# Patient Record
Sex: Male | Born: 1940
Health system: Southern US, Community
[De-identification: ages and names within clinical notes are randomized; demographics above are authoritative.]

## PROBLEM LIST (undated history)

## (undated) DIAGNOSIS — Z9289 Personal history of other medical treatment: Secondary | ICD-10-CM

## (undated) DIAGNOSIS — Q999 Chromosomal abnormality, unspecified: Secondary | ICD-10-CM

## (undated) DIAGNOSIS — R0789 Other chest pain: Secondary | ICD-10-CM

## (undated) DIAGNOSIS — I2510 Atherosclerotic heart disease of native coronary artery without angina pectoris: Secondary | ICD-10-CM

## (undated) DIAGNOSIS — E6609 Other obesity due to excess calories: Secondary | ICD-10-CM

## (undated) DIAGNOSIS — E785 Hyperlipidemia, unspecified: Secondary | ICD-10-CM

## (undated) DIAGNOSIS — Z951 Presence of aortocoronary bypass graft: Secondary | ICD-10-CM

## (undated) DIAGNOSIS — E1169 Type 2 diabetes mellitus with other specified complication: Secondary | ICD-10-CM

## (undated) HISTORY — DX: Personal history of other medical treatment: Z92.89

## (undated) HISTORY — DX: Atherosclerotic heart disease of native coronary artery without angina pectoris: I25.10

## (undated) HISTORY — DX: Type 2 diabetes mellitus with other specified complication: E11.69

## (undated) HISTORY — DX: Other chest pain: R07.89

## (undated) HISTORY — DX: Hyperlipidemia, unspecified: E78.5

## (undated) HISTORY — DX: Presence of aortocoronary bypass graft: Z95.1

## (undated) HISTORY — PX: REPLACEMENT TOTAL KNEE: SUR1224

## (undated) HISTORY — DX: Type 2 diabetes mellitus with other specified complication: Q99.9

## (undated) HISTORY — DX: Other obesity due to excess calories: E66.09

---

## 1995-02-16 HISTORY — PX: CORONARY ANGIOPLASTY: SHX604

## 2003-12-23 ENCOUNTER — Encounter: Admission: RE | Admit: 2003-12-23 | Discharge: 2003-12-23 | Payer: Self-pay | Admitting: *Deleted

## 2004-07-16 HISTORY — PX: INTRAOCULAR LENS INSERTION: SHX110

## 2005-02-15 DIAGNOSIS — Z951 Presence of aortocoronary bypass graft: Secondary | ICD-10-CM

## 2005-02-15 HISTORY — PX: CORONARY ARTERY BYPASS GRAFT: SHX141

## 2005-02-15 HISTORY — DX: Presence of aortocoronary bypass graft: Z95.1

## 2006-10-12 ENCOUNTER — Other Ambulatory Visit: Payer: Self-pay

## 2006-10-12 ENCOUNTER — Inpatient Hospital Stay: Payer: Self-pay | Admitting: Orthopaedic Surgery

## 2010-05-17 HISTORY — PX: CORONARY ANGIOPLASTY WITH STENT PLACEMENT: SHX49

## 2010-05-17 HISTORY — PX: TRANSTHORACIC ECHOCARDIOGRAM: SHX275

## 2010-05-26 ENCOUNTER — Observation Stay (HOSPITAL_COMMUNITY)
Admission: RE | Admit: 2010-05-26 | Discharge: 2010-05-27 | Disposition: A | Discharge status: Home / Self Care | Payer: Managed Care, Other (non HMO) | Source: Ambulatory Visit | Attending: Cardiology | Admitting: Cardiology

## 2010-05-26 DIAGNOSIS — I209 Angina pectoris, unspecified: Secondary | ICD-10-CM | POA: Insufficient documentation

## 2010-05-26 DIAGNOSIS — E785 Hyperlipidemia, unspecified: Secondary | ICD-10-CM | POA: Insufficient documentation

## 2010-05-26 DIAGNOSIS — I10 Essential (primary) hypertension: Secondary | ICD-10-CM | POA: Insufficient documentation

## 2010-05-26 DIAGNOSIS — Z951 Presence of aortocoronary bypass graft: Secondary | ICD-10-CM | POA: Insufficient documentation

## 2010-05-26 DIAGNOSIS — Z01812 Encounter for preprocedural laboratory examination: Secondary | ICD-10-CM | POA: Insufficient documentation

## 2010-05-26 DIAGNOSIS — Z6833 Body mass index (BMI) 33.0-33.9, adult: Secondary | ICD-10-CM | POA: Insufficient documentation

## 2010-05-26 DIAGNOSIS — Z01810 Encounter for preprocedural cardiovascular examination: Secondary | ICD-10-CM | POA: Insufficient documentation

## 2010-05-26 DIAGNOSIS — E669 Obesity, unspecified: Secondary | ICD-10-CM | POA: Insufficient documentation

## 2010-05-26 DIAGNOSIS — E119 Type 2 diabetes mellitus without complications: Secondary | ICD-10-CM | POA: Insufficient documentation

## 2010-05-26 DIAGNOSIS — I2510 Atherosclerotic heart disease of native coronary artery without angina pectoris: Principal | ICD-10-CM | POA: Insufficient documentation

## 2010-05-26 DIAGNOSIS — I252 Old myocardial infarction: Secondary | ICD-10-CM | POA: Insufficient documentation

## 2010-05-26 LAB — BASIC METABOLIC PANEL WITH GFR
BUN: 11 mg/dL (ref 6–23)
CO2: 29 meq/L (ref 19–32)
Calcium: 9.4 mg/dL (ref 8.4–10.5)
Chloride: 100 meq/L (ref 96–112)
Creatinine, Ser: 0.88 mg/dL (ref 0.4–1.5)
GFR calc non Af Amer: >60 mL/min
Glucose, Bld: 190 mg/dL — ABNORMAL HIGH (ref 70–99)
Potassium: 4.5 meq/L (ref 3.5–5.1)
Sodium: 136 meq/L (ref 135–145)

## 2010-05-26 LAB — URINALYSIS, ROUTINE W REFLEX MICROSCOPIC
Hgb urine dipstick: NEGATIVE
Nitrite: NEGATIVE
Protein, ur: NEGATIVE mg/dL
Specific Gravity, Urine: 1.018 (ref 1.005–1.030)
Urobilinogen, UA: 1 mg/dL (ref 0.0–1.0)

## 2010-05-26 LAB — CBC
HCT: 40.3 % (ref 39.0–52.0)
Hemoglobin: 13.9 g/dL (ref 13.0–17.0)
MCH: 32 pg (ref 26.0–34.0)
MCHC: 34.5 g/dL (ref 30.0–36.0)
MCV: 92.9 fL (ref 78.0–100.0)
Platelets: 160 K/uL (ref 150–400)
RBC: 4.34 MIL/uL (ref 4.22–5.81)
RDW: 13.2 % (ref 11.5–15.5)
WBC: 6.5 K/uL (ref 4.0–10.5)

## 2010-05-26 LAB — PROTIME-INR
INR: 1.06 (ref 0.00–1.49)
Prothrombin Time: 14 s (ref 11.6–15.2)

## 2010-05-26 LAB — GLUCOSE, CAPILLARY
Glucose-Capillary: 121 mg/dL — ABNORMAL HIGH (ref 70–99)
Glucose-Capillary: 169 mg/dL — ABNORMAL HIGH (ref 70–99)

## 2010-05-26 LAB — POCT ACTIVATED CLOTTING TIME: Activated Clotting Time: 482 seconds

## 2010-05-27 LAB — BASIC METABOLIC PANEL
CO2: 26 mEq/L (ref 19–32)
Chloride: 103 mEq/L (ref 96–112)
GFR calc Af Amer: >60 mL/min (ref >60)
Potassium: 4.3 mEq/L (ref 3.5–5.1)

## 2010-05-27 LAB — CBC
Hemoglobin: 12.4 g/dL — ABNORMAL LOW (ref 13.0–17.0)
MCH: 31.8 pg (ref 26.0–34.0)
MCV: 93.1 fL (ref 78.0–100.0)
Platelets: 142 10*3/uL — ABNORMAL LOW (ref 150–400)
RBC: 3.9 MIL/uL — ABNORMAL LOW (ref 4.22–5.81)
WBC: 6.9 10*3/uL (ref 4.0–10.5)

## 2010-05-27 LAB — GLUCOSE, CAPILLARY: Glucose-Capillary: 207 mg/dL — ABNORMAL HIGH (ref 70–99)

## 2010-05-27 LAB — CARDIAC PANEL(CRET KIN+CKTOT+MB+TROPI): Relative Index: INVALID (ref 0.0–2.5)

## 2010-06-03 NOTE — Cardiovascular Report (Signed)
Gregory [name], [name]                ACCOUNT NO.:  [id number]  MEDICAL RECORD NO.:  [id number]           PATIENT TYPE:  O  LOCATION:  6531                         FACILITY:  [facility]  PHYSICIAN:  Gregory [name]. Gregory [name], M.D. DATE OF BIRTH:  1940-10-25  DATE OF PROCEDURE:  05/26/2010 DATE OF DISCHARGE:                           CARDIAC CATHETERIZATION   INDICATION FOR TEST:  This 70 year old male had had a previous heart attack and subsequently underwent bypass surgery in 2007 at [facility] in [place].  He began having anginal symptoms and had a positive nuclear study performed.  Because of this, he is brought to the Cath Lab for cardiac catheterization.  Of note, he has not had a cath since his bypass surgery in 2007.  PROCEDURE:  After obtaining informed consent, the patient was prepped and draped in the usual sterile fashion exposing the right groin. Following local anesthetic with 1% Xylocaine, the Seldinger technique was employed and a 5-French introducer sheath was placed in the right femoral artery.  Left and right coronary arteriography, graft visualization, ventriculography, and aortic root injection x2 was performed.  Following this, PCI to the native circumflex was performed.  EQUIPMENT:  5-French Judkins diagnostic catheters interventional equipment as listed below.  TOTAL CONTRAST:  295 mL  COMPLICATIONS:  None.  MEDICATIONS:  The patient was given IV Angiomax, had an ACT of 482 and Plavix 300 mg p.o. following the intervention.  He is chronically on Plavix.  RESULTS: 1. Hemodynamic monitoring:  His central aortic pressure was 154/79.     His left ventricular pressure was 154/10. 2. Ventriculography:  Ventriculography in the RAO projection revealed     normal systolic function, 55% ejection fraction, EDP of 15, no     focal wall motion abnormalities. 3. Aortic root:  Aortic root injections x2 were performed, one right     above the aortic valve and then one in the  midportion of the     ascending aortic valve.  The aortic root appeared to be slightly     dilated.  The right coronary artery ostium was easily seen because     of stents and calcification.  However, there was no contrast flow     into the RCA, making this a flush occlusion of the RCA,  in     addition to this, none of the saphenous vein grafts were seen on     aortic root injection.  I was able to selectively cannulate the     vein graft to the PL which did not have any markers, but it was     occluded proximally.  GRAFTS: 1. Internal mammary artery to the LAD.  The LIMA was widely patent.     It inserted into the diagonal and from the diagonal there was     retrograde flow down the LAD.  This system appeared to be free of     disease. 2. Saphenous vein graft to the PL 100% occluded at the ostium. 3. Saphenous vein to the OM, assumed to be occluded.  It was never     visualized subselectively and was not present  on 2 aortic root     injections. 4. Right coronary artery.  The right coronary artery was occluded at     its takeoff from the aorta.  There was a large collateral bend that     filled the RCA retrograde almost back to the aortic root itself.     Of note, this was not visualized with an aortic root injection     either. 5. LAD:  The LAD crossed the apex and was free of disease.  There was     a large first diagonal that had bidirectional flow.  It was free of     disease.  The second diagonal was small and had diffuse areas of 70-     80% narrowing and was too small for any type of intervention.  This     will need be managed medically only. 6. Circumflex:  The circumflex was large.  It had a very large AV     groove circumflex with several posterolateral vessels.  In fact,     you could actually see a small terminal portion of the saphenous     vein graft that went to the OMs, filled retrograde.  There was a     shelf-like lesion in the proximal portion of the circumflex  near     the takeoff of the small OM.  This OM was not involved in this area     of obstruction.  After evaluating the films with Dr. Allyson [name], decision was made to intervene upon the proximal circumflex lesion.  There was good collateral flow to the right coronary artery and this was the only area that appeared to have jeopardized flow.  A 6-French BL-5 (loading) guide catheter was used and a short Luge wire. The wire was easily placed down the circumflex.  Primary stenting was accomplished with a 3.5 x 14 Promus drug-eluting stent.  The initial inflation was 19 atmospheres for 55 seconds and the second inflation was 19 atmospheres for 33 seconds.  Postdilatation was accomplished with a 4.0 x 18 [place] Quantum, two inflations 16 x 36 and then 18 x 36 were performed, making sure that the postdilatation balloon stayed within the margins of the stent.  The area that was 80% narrowed preintervention now appeared to be normal.  The ostium of the takeoff of the OM was not involved with the intervention and was not narrowed by the PCI.  The patient will be hydrated overnight because of the 295 mL of contrast use.  He should be ready for discharge in the morning.  We will check his renal functions.          ______________________________ Gregory [name], M.D.     ABL/MEDQ  D:  05/26/2010  T:  05/27/2010  Job:  [id number]  cc:   Gregory [name], M.D. Gregory [name], M.D.  Electronically Signed by Gregory [name] M.D. on 06/03/2010 08:24:40 AM

## 2010-06-15 ENCOUNTER — Other Ambulatory Visit: Payer: Self-pay | Admitting: Cardiology

## 2010-06-15 ENCOUNTER — Encounter (HOSPITAL_COMMUNITY): Payer: Managed Care, Other (non HMO) | Attending: Cardiology

## 2010-06-15 DIAGNOSIS — E785 Hyperlipidemia, unspecified: Secondary | ICD-10-CM | POA: Insufficient documentation

## 2010-06-15 DIAGNOSIS — E119 Type 2 diabetes mellitus without complications: Secondary | ICD-10-CM | POA: Insufficient documentation

## 2010-06-15 DIAGNOSIS — Z9861 Coronary angioplasty status: Secondary | ICD-10-CM | POA: Insufficient documentation

## 2010-06-15 DIAGNOSIS — Z5189 Encounter for other specified aftercare: Secondary | ICD-10-CM | POA: Insufficient documentation

## 2010-06-15 DIAGNOSIS — I209 Angina pectoris, unspecified: Secondary | ICD-10-CM | POA: Insufficient documentation

## 2010-06-15 DIAGNOSIS — I2510 Atherosclerotic heart disease of native coronary artery without angina pectoris: Secondary | ICD-10-CM | POA: Insufficient documentation

## 2010-06-15 DIAGNOSIS — Z951 Presence of aortocoronary bypass graft: Secondary | ICD-10-CM | POA: Insufficient documentation

## 2010-06-15 DIAGNOSIS — I252 Old myocardial infarction: Secondary | ICD-10-CM | POA: Insufficient documentation

## 2010-06-15 DIAGNOSIS — I10 Essential (primary) hypertension: Secondary | ICD-10-CM | POA: Insufficient documentation

## 2010-06-15 DIAGNOSIS — E669 Obesity, unspecified: Secondary | ICD-10-CM | POA: Insufficient documentation

## 2010-06-15 LAB — GLUCOSE, CAPILLARY: Glucose-Capillary: 241 mg/dL — ABNORMAL HIGH (ref 70–99)

## 2010-06-17 ENCOUNTER — Encounter (HOSPITAL_COMMUNITY): Payer: Managed Care, Other (non HMO) | Attending: Cardiology

## 2010-06-17 ENCOUNTER — Other Ambulatory Visit: Payer: Self-pay | Admitting: Cardiology

## 2010-06-17 DIAGNOSIS — E785 Hyperlipidemia, unspecified: Secondary | ICD-10-CM | POA: Insufficient documentation

## 2010-06-17 DIAGNOSIS — Z951 Presence of aortocoronary bypass graft: Secondary | ICD-10-CM | POA: Insufficient documentation

## 2010-06-17 DIAGNOSIS — I2510 Atherosclerotic heart disease of native coronary artery without angina pectoris: Secondary | ICD-10-CM | POA: Insufficient documentation

## 2010-06-17 DIAGNOSIS — E669 Obesity, unspecified: Secondary | ICD-10-CM | POA: Insufficient documentation

## 2010-06-17 DIAGNOSIS — E119 Type 2 diabetes mellitus without complications: Secondary | ICD-10-CM | POA: Insufficient documentation

## 2010-06-17 DIAGNOSIS — I252 Old myocardial infarction: Secondary | ICD-10-CM | POA: Insufficient documentation

## 2010-06-17 DIAGNOSIS — Z5189 Encounter for other specified aftercare: Secondary | ICD-10-CM | POA: Insufficient documentation

## 2010-06-17 DIAGNOSIS — I209 Angina pectoris, unspecified: Secondary | ICD-10-CM | POA: Insufficient documentation

## 2010-06-17 DIAGNOSIS — Z9861 Coronary angioplasty status: Secondary | ICD-10-CM | POA: Insufficient documentation

## 2010-06-17 DIAGNOSIS — I10 Essential (primary) hypertension: Secondary | ICD-10-CM | POA: Insufficient documentation

## 2010-06-17 LAB — GLUCOSE, CAPILLARY: Glucose-Capillary: 175 mg/dL — ABNORMAL HIGH (ref 70–99)

## 2010-06-18 LAB — GLUCOSE, CAPILLARY: Glucose-Capillary: 226 mg/dL — ABNORMAL HIGH (ref 70–99)

## 2010-06-19 ENCOUNTER — Encounter (HOSPITAL_COMMUNITY): Payer: Managed Care, Other (non HMO)

## 2010-06-22 ENCOUNTER — Encounter (HOSPITAL_COMMUNITY): Payer: Managed Care, Other (non HMO)

## 2010-06-24 ENCOUNTER — Other Ambulatory Visit: Payer: Self-pay | Admitting: Cardiology

## 2010-06-24 ENCOUNTER — Encounter (HOSPITAL_COMMUNITY): Payer: Managed Care, Other (non HMO)

## 2010-06-24 LAB — GLUCOSE, CAPILLARY: Glucose-Capillary: 183 mg/dL — ABNORMAL HIGH (ref 70–99)

## 2010-06-26 ENCOUNTER — Other Ambulatory Visit: Payer: Self-pay | Admitting: Cardiology

## 2010-06-26 ENCOUNTER — Encounter (HOSPITAL_COMMUNITY): Payer: Managed Care, Other (non HMO)

## 2010-06-29 ENCOUNTER — Encounter (HOSPITAL_COMMUNITY): Payer: Managed Care, Other (non HMO)

## 2010-07-01 ENCOUNTER — Encounter (HOSPITAL_COMMUNITY): Payer: Managed Care, Other (non HMO)

## 2010-07-03 ENCOUNTER — Encounter (HOSPITAL_COMMUNITY): Payer: Managed Care, Other (non HMO)

## 2010-07-06 ENCOUNTER — Encounter (HOSPITAL_COMMUNITY): Payer: Managed Care, Other (non HMO)

## 2010-07-08 ENCOUNTER — Encounter (HOSPITAL_COMMUNITY): Payer: Managed Care, Other (non HMO)

## 2010-07-10 ENCOUNTER — Encounter (HOSPITAL_COMMUNITY): Payer: Managed Care, Other (non HMO)

## 2010-07-13 ENCOUNTER — Encounter (HOSPITAL_COMMUNITY): Payer: Managed Care, Other (non HMO)

## 2010-07-14 NOTE — Discharge Summary (Signed)
  Gregory [name], Gregory [name]                ACCOUNT NO.:  [id number]  MEDICAL RECORD NO.:  [id number]           PATIENT TYPE:  O  LOCATION:  6531                         FACILITY:  [facility]  PHYSICIAN:  Kimyah Frein A. Alanda Amass, M.D.DATE OF BIRTH:  1940-12-18  DATE OF ADMISSION:  05/26/2010 DATE OF DISCHARGE:  05/27/2010                              DISCHARGE SUMMARY   DISCHARGE DIAGNOSES: 1. Chest pain consistent with angina. 2. Abnormal Myoview. 3. Coronary disease with coronary artery bypass grafting in June 2007     in [place], progression of disease, catheterization this admission     with occluded SVG to the PL and SVG to the OM with a patent LIMA to     the diagonal, status post native circumflex Promus stenting this     admission. 4. Type 2 non-insulin-dependent diabetes. 5. Treated dyslipidemia. 6. Obesity with a BMI of 33. 7. Normal left ventricular function.  HOSPITAL COURSE:  The patient is a pleasant 70 year old male followed by Dr. Alanda Amass and Dr. Wylene Simmer withy coronary disease as described above. He had bypass grafting in June 2007 in [place].  He has had some chest pain as an outpatient and underwent Myoview study, which was abnormal suggesting some anterior ischemia.  He was set up for diagnostic catheterization, which was on May 26, 2010, by Dr. Clarene Duke.  This revealed occluded vein graft to the PL, and occluded vein graft to the OM with a patent LIMA to the diagonal.  He underwent native circumflex stenting with a Promus stent.  He tolerated this well.  We feel he can be discharged on May 27, 2010.  He did have a radial approach, and this is stable at discharge.  LABORATORY DATA AT DISCHARGE:  Sodium 138, potassium 4.3, BUN 9, creatinine 0.98.  White count 6.9, hemoglobin 12.4, hematocrit 36.3, platelets 142, troponin I postprocedure was 0.09.  EKG shows sinus rhythm without acute changes.  DISPOSITION:  The patient discharged in stable condition.  He will follow up  with Dr. Alanda Amass at 1-2 weeks.  He was already on aspirin and Plavix and will continue this.  He has been instructed to hold his metformin for 48 hours.     Abelino Derrick, P.A.   ______________________________ Pearletha Furl Alanda Amass, M.D.    Lenard Lance  D:  05/27/2010  T:  05/28/2010  Job:  [id number]  cc:   Gaspar Garbe, M.D.  Electronically Signed by Corine Shelter P.A. on 06/01/2010 04:11:44 PM Electronically Signed by Susa Griffins M.D. on 07/14/2010 12:46:16 PM

## 2010-07-15 ENCOUNTER — Encounter (HOSPITAL_COMMUNITY): Payer: Managed Care, Other (non HMO)

## 2010-07-15 ENCOUNTER — Other Ambulatory Visit: Payer: Self-pay | Admitting: Cardiology

## 2010-07-17 ENCOUNTER — Encounter (HOSPITAL_COMMUNITY): Payer: Managed Care, Other (non HMO) | Attending: Cardiology

## 2010-07-17 DIAGNOSIS — I10 Essential (primary) hypertension: Secondary | ICD-10-CM | POA: Insufficient documentation

## 2010-07-17 DIAGNOSIS — I2510 Atherosclerotic heart disease of native coronary artery without angina pectoris: Secondary | ICD-10-CM | POA: Insufficient documentation

## 2010-07-17 DIAGNOSIS — I209 Angina pectoris, unspecified: Secondary | ICD-10-CM | POA: Insufficient documentation

## 2010-07-17 DIAGNOSIS — Z5189 Encounter for other specified aftercare: Secondary | ICD-10-CM | POA: Insufficient documentation

## 2010-07-17 DIAGNOSIS — E669 Obesity, unspecified: Secondary | ICD-10-CM | POA: Insufficient documentation

## 2010-07-17 DIAGNOSIS — Z9861 Coronary angioplasty status: Secondary | ICD-10-CM | POA: Insufficient documentation

## 2010-07-17 DIAGNOSIS — I252 Old myocardial infarction: Secondary | ICD-10-CM | POA: Insufficient documentation

## 2010-07-17 DIAGNOSIS — E785 Hyperlipidemia, unspecified: Secondary | ICD-10-CM | POA: Insufficient documentation

## 2010-07-17 DIAGNOSIS — Z951 Presence of aortocoronary bypass graft: Secondary | ICD-10-CM | POA: Insufficient documentation

## 2010-07-17 DIAGNOSIS — E119 Type 2 diabetes mellitus without complications: Secondary | ICD-10-CM | POA: Insufficient documentation

## 2010-07-20 ENCOUNTER — Encounter (HOSPITAL_COMMUNITY): Payer: Managed Care, Other (non HMO)

## 2010-07-22 ENCOUNTER — Encounter (HOSPITAL_COMMUNITY): Payer: Managed Care, Other (non HMO)

## 2010-07-24 ENCOUNTER — Encounter (HOSPITAL_COMMUNITY): Payer: Managed Care, Other (non HMO)

## 2010-07-27 ENCOUNTER — Encounter (HOSPITAL_COMMUNITY): Payer: Managed Care, Other (non HMO)

## 2010-07-29 ENCOUNTER — Encounter (HOSPITAL_COMMUNITY): Payer: Managed Care, Other (non HMO)

## 2010-07-31 ENCOUNTER — Encounter (HOSPITAL_COMMUNITY): Payer: Managed Care, Other (non HMO)

## 2010-08-03 ENCOUNTER — Encounter (HOSPITAL_COMMUNITY): Payer: Managed Care, Other (non HMO)

## 2010-08-05 ENCOUNTER — Encounter (HOSPITAL_COMMUNITY): Payer: Managed Care, Other (non HMO)

## 2010-08-07 ENCOUNTER — Encounter (HOSPITAL_COMMUNITY): Payer: Managed Care, Other (non HMO)

## 2010-08-10 ENCOUNTER — Encounter (HOSPITAL_COMMUNITY): Payer: Managed Care, Other (non HMO)

## 2010-08-12 ENCOUNTER — Encounter (HOSPITAL_COMMUNITY): Payer: Managed Care, Other (non HMO)

## 2010-08-14 ENCOUNTER — Encounter (HOSPITAL_COMMUNITY): Payer: Managed Care, Other (non HMO)

## 2010-08-17 ENCOUNTER — Encounter (HOSPITAL_COMMUNITY): Payer: Managed Care, Other (non HMO) | Attending: Cardiology

## 2010-08-17 DIAGNOSIS — E785 Hyperlipidemia, unspecified: Secondary | ICD-10-CM | POA: Insufficient documentation

## 2010-08-17 DIAGNOSIS — Z9861 Coronary angioplasty status: Secondary | ICD-10-CM | POA: Insufficient documentation

## 2010-08-17 DIAGNOSIS — Z951 Presence of aortocoronary bypass graft: Secondary | ICD-10-CM | POA: Insufficient documentation

## 2010-08-17 DIAGNOSIS — E669 Obesity, unspecified: Secondary | ICD-10-CM | POA: Insufficient documentation

## 2010-08-17 DIAGNOSIS — I209 Angina pectoris, unspecified: Secondary | ICD-10-CM | POA: Insufficient documentation

## 2010-08-17 DIAGNOSIS — I252 Old myocardial infarction: Secondary | ICD-10-CM | POA: Insufficient documentation

## 2010-08-17 DIAGNOSIS — I2510 Atherosclerotic heart disease of native coronary artery without angina pectoris: Secondary | ICD-10-CM | POA: Insufficient documentation

## 2010-08-17 DIAGNOSIS — Z5189 Encounter for other specified aftercare: Secondary | ICD-10-CM | POA: Insufficient documentation

## 2010-08-17 DIAGNOSIS — E119 Type 2 diabetes mellitus without complications: Secondary | ICD-10-CM | POA: Insufficient documentation

## 2010-08-17 DIAGNOSIS — I10 Essential (primary) hypertension: Secondary | ICD-10-CM | POA: Insufficient documentation

## 2010-08-19 ENCOUNTER — Encounter (HOSPITAL_COMMUNITY): Payer: Managed Care, Other (non HMO)

## 2010-08-21 ENCOUNTER — Encounter (HOSPITAL_COMMUNITY): Payer: Managed Care, Other (non HMO)

## 2010-08-24 ENCOUNTER — Encounter (HOSPITAL_COMMUNITY): Payer: Managed Care, Other (non HMO)

## 2010-08-26 ENCOUNTER — Encounter (HOSPITAL_COMMUNITY): Payer: Managed Care, Other (non HMO)

## 2010-08-28 ENCOUNTER — Encounter (HOSPITAL_COMMUNITY): Payer: Managed Care, Other (non HMO)

## 2010-08-31 ENCOUNTER — Encounter (HOSPITAL_COMMUNITY): Payer: Managed Care, Other (non HMO)

## 2010-09-02 ENCOUNTER — Encounter (HOSPITAL_COMMUNITY): Payer: Managed Care, Other (non HMO)

## 2010-09-04 ENCOUNTER — Encounter (HOSPITAL_COMMUNITY): Payer: Managed Care, Other (non HMO)

## 2010-09-07 ENCOUNTER — Encounter (HOSPITAL_COMMUNITY): Payer: Managed Care, Other (non HMO)

## 2010-09-09 ENCOUNTER — Encounter (HOSPITAL_COMMUNITY): Payer: Managed Care, Other (non HMO)

## 2010-09-11 ENCOUNTER — Encounter (HOSPITAL_COMMUNITY): Payer: Managed Care, Other (non HMO)

## 2010-09-14 ENCOUNTER — Encounter (HOSPITAL_COMMUNITY): Payer: Managed Care, Other (non HMO)

## 2010-09-16 ENCOUNTER — Encounter (HOSPITAL_COMMUNITY): Payer: Managed Care, Other (non HMO) | Attending: Cardiology

## 2010-09-16 DIAGNOSIS — E785 Hyperlipidemia, unspecified: Secondary | ICD-10-CM | POA: Insufficient documentation

## 2010-09-16 DIAGNOSIS — Z9861 Coronary angioplasty status: Secondary | ICD-10-CM | POA: Insufficient documentation

## 2010-09-16 DIAGNOSIS — Z5189 Encounter for other specified aftercare: Secondary | ICD-10-CM | POA: Insufficient documentation

## 2010-09-16 DIAGNOSIS — E119 Type 2 diabetes mellitus without complications: Secondary | ICD-10-CM | POA: Insufficient documentation

## 2010-09-16 DIAGNOSIS — E669 Obesity, unspecified: Secondary | ICD-10-CM | POA: Insufficient documentation

## 2010-09-16 DIAGNOSIS — I209 Angina pectoris, unspecified: Secondary | ICD-10-CM | POA: Insufficient documentation

## 2010-09-16 DIAGNOSIS — I2510 Atherosclerotic heart disease of native coronary artery without angina pectoris: Secondary | ICD-10-CM | POA: Insufficient documentation

## 2010-09-16 DIAGNOSIS — I10 Essential (primary) hypertension: Secondary | ICD-10-CM | POA: Insufficient documentation

## 2010-09-16 DIAGNOSIS — I252 Old myocardial infarction: Secondary | ICD-10-CM | POA: Insufficient documentation

## 2010-09-16 DIAGNOSIS — Z951 Presence of aortocoronary bypass graft: Secondary | ICD-10-CM | POA: Insufficient documentation

## 2010-09-18 ENCOUNTER — Encounter (HOSPITAL_COMMUNITY): Payer: Managed Care, Other (non HMO)

## 2011-09-16 HISTORY — PX: CARDIAC CATHETERIZATION: SHX172

## 2011-09-22 DIAGNOSIS — Z9289 Personal history of other medical treatment: Secondary | ICD-10-CM

## 2011-09-22 HISTORY — DX: Personal history of other medical treatment: Z92.89

## 2011-09-24 ENCOUNTER — Ambulatory Visit
Admission: RE | Admit: 2011-09-24 | Discharge: 2011-09-24 | Disposition: A | Discharge status: Home / Self Care | Payer: Managed Care, Other (non HMO) | Source: Ambulatory Visit | Attending: Cardiovascular Disease | Admitting: Cardiovascular Disease

## 2011-09-24 ENCOUNTER — Other Ambulatory Visit: Payer: Self-pay | Admitting: Internal Medicine

## 2011-09-24 ENCOUNTER — Other Ambulatory Visit: Payer: Self-pay | Admitting: Cardiovascular Disease

## 2011-09-24 DIAGNOSIS — R079 Chest pain, unspecified: Secondary | ICD-10-CM

## 2011-09-24 DIAGNOSIS — Z01811 Encounter for preprocedural respiratory examination: Secondary | ICD-10-CM

## 2011-09-29 ENCOUNTER — Encounter (HOSPITAL_COMMUNITY)
Admission: RE | Disposition: A | Discharge status: Home / Self Care | Payer: Self-pay | Source: Ambulatory Visit | Attending: Internal Medicine

## 2011-09-29 ENCOUNTER — Ambulatory Visit (HOSPITAL_COMMUNITY)
Admission: RE | Admit: 2011-09-29 | Discharge: 2011-09-29 | Disposition: A | Discharge status: Home / Self Care | Payer: Managed Care, Other (non HMO) | Source: Ambulatory Visit | Attending: Internal Medicine | Admitting: Internal Medicine

## 2011-09-29 DIAGNOSIS — I25810 Atherosclerosis of coronary artery bypass graft(s) without angina pectoris: Secondary | ICD-10-CM | POA: Insufficient documentation

## 2011-09-29 DIAGNOSIS — T82897A Other specified complication of cardiac prosthetic devices, implants and grafts, initial encounter: Secondary | ICD-10-CM | POA: Insufficient documentation

## 2011-09-29 DIAGNOSIS — I2510 Atherosclerotic heart disease of native coronary artery without angina pectoris: Secondary | ICD-10-CM | POA: Insufficient documentation

## 2011-09-29 DIAGNOSIS — R079 Chest pain, unspecified: Secondary | ICD-10-CM | POA: Insufficient documentation

## 2011-09-29 DIAGNOSIS — Y831 Surgical operation with implant of artificial internal device as the cause of abnormal reaction of the patient, or of later complication, without mention of misadventure at the time of the procedure: Secondary | ICD-10-CM | POA: Insufficient documentation

## 2011-09-29 DIAGNOSIS — Z9861 Coronary angioplasty status: Secondary | ICD-10-CM | POA: Insufficient documentation

## 2011-09-29 HISTORY — PX: LEFT HEART CATHETERIZATION WITH CORONARY/GRAFT ANGIOGRAM: SHX5450

## 2011-09-29 LAB — GLUCOSE, CAPILLARY: Glucose-Capillary: 170 mg/dL — ABNORMAL HIGH (ref 70–99)

## 2011-09-29 SURGERY — LEFT HEART CATHETERIZATION WITH CORONARY/GRAFT ANGIOGRAM
Anesthesia: LOCAL

## 2011-09-29 MED ORDER — DIAZEPAM 5 MG PO TABS
5.0000 mg | ORAL_TABLET | ORAL | Status: AC
  Administered 2011-09-29: 5 mg via ORAL

## 2011-09-29 MED ORDER — HEPARIN (PORCINE) IN NACL 2-0.9 UNIT/ML-% IJ SOLN
INTRAMUSCULAR | Status: AC
  Filled 2011-09-29: qty 2000

## 2011-09-29 MED ORDER — FENTANYL CITRATE 0.05 MG/ML IJ SOLN
INTRAMUSCULAR | Status: AC
  Filled 2011-09-29: qty 2

## 2011-09-29 MED ORDER — HYDRALAZINE HCL 20 MG/ML IJ SOLN
INTRAMUSCULAR | Status: AC
  Filled 2011-09-29: qty 1

## 2011-09-29 MED ORDER — ONDANSETRON HCL 4 MG/2ML IJ SOLN: 4.0000 mg | Freq: Four times a day (QID) | INTRAMUSCULAR | Status: DC | PRN

## 2011-09-29 MED ORDER — ASPIRIN 81 MG PO CHEW
324.0000 mg | CHEWABLE_TABLET | ORAL | Status: AC
  Administered 2011-09-29: 324 mg via ORAL

## 2011-09-29 MED ORDER — SODIUM CHLORIDE 0.9 % IJ SOLN: 3.0000 mL | INTRAMUSCULAR | Status: DC | PRN

## 2011-09-29 MED ORDER — MIDAZOLAM HCL 2 MG/2ML IJ SOLN
INTRAMUSCULAR | Status: AC
  Filled 2011-09-29: qty 2

## 2011-09-29 MED ORDER — NITROGLYCERIN 0.2 MG/ML ON CALL CATH LAB
INTRAVENOUS | Status: AC
  Filled 2011-09-29: qty 1

## 2011-09-29 MED ORDER — DIAZEPAM 5 MG PO TABS
ORAL_TABLET | ORAL | Status: AC
  Administered 2011-09-29: 5 mg via ORAL
  Filled 2011-09-29: qty 1

## 2011-09-29 MED ORDER — LIDOCAINE HCL (PF) 1 % IJ SOLN
INTRAMUSCULAR | Status: AC
  Filled 2011-09-29: qty 30

## 2011-09-29 MED ORDER — SODIUM CHLORIDE 0.9 % IV SOLN: 1.0000 mL/kg/h | INTRAVENOUS | Status: DC

## 2011-09-29 MED ORDER — SODIUM CHLORIDE 0.9 % IV SOLN
INTRAVENOUS | Status: DC
  Administered 2011-09-29: 08:00:00 via INTRAVENOUS

## 2011-09-29 MED ORDER — HYDRALAZINE HCL 20 MG/ML IJ SOLN: 10.0000 mg | Freq: Once | INTRAMUSCULAR | Status: DC

## 2011-09-29 MED ORDER — ACETAMINOPHEN 325 MG PO TABS: 650.0000 mg | ORAL_TABLET | ORAL | Status: DC | PRN

## 2011-09-29 MED ORDER — ASPIRIN 81 MG PO CHEW
CHEWABLE_TABLET | ORAL | Status: AC
  Administered 2011-09-29: 324 mg via ORAL
  Filled 2011-09-29: qty 4

## 2011-09-29 NOTE — CV Procedure (Signed)
[facility]     CARDIAC CATHETERIZATION REPORT  Gregory [name]   [id number] [birth year]  Performing Cardiologist: Chrystie Nose Primary Physician: Gaspar Garbe, MD Primary Cardiologist:  Dr. Alanda Amass  Procedures Performed:  Left Heart Catheterization via 5 Fr right femoral artery access  Native Coronary Angiography  Left Internal Mammary Graft Angiography)  Indication(s): Chest pain, abnormal nuclear stress test  History: 72 y.o. male with a history of CAD s/p CABG x 3 vessels (LIMA-LAD, SVG to OM and SVG to PDA) in 2007.  He has had prior stents placed in the proximal LAD as well as the proximal LCX by Dr. Clarene Duke in our practice in 2012. His cath at that time showed occluded SVG to PDA as well as a flush occluded (and calcified) native RCA. The LIMA was patent. He presents for [facility] after ongoing complaints of chest pain which have some typical and atypical features. A stress test showed a fixed inferior defect with a small amount of peri-infarct ischemia. Dr. Alanda Amass was concerned about problems with the LCX, possibly at the recently placed stent. Exercise did reveal 3 mm ST segment depression.   Consent: The procedure with Risks/Benefits/Alternatives and Indications was reviewed with the patient (and family).  All questions were answered.    Risks / Complications include, but not limited to: Death, MI, CVA/TIA, VF/VT (with defibrillation), Bradycardia (need for temporary pacer placement), contrast induced nephropathy, bleeding / bruising / hematoma / pseudoaneurysm, vascular or coronary injury (with possible emergent CT or Vascular Surgery), adverse medication reactions, infection.    The patient (and family) voice understanding and agree to proceed.    Risks of procedure as well as the alternatives and risks of each were explained to the (patient/caregiver).  Consent for procedure obtained. Consent for signed by MD and patient with RN witness -- placed  on chart.  Procedure: The patient was brought to the 2nd Floor [facility] Cardiac Catheterization Lab in the fasting state and prepped and draped in the usual sterile fashion for (Right groin) access.   Sterile technique was used including antiseptics, cap, gloves, gown, hand hygiene, mask and sheet.  Skin prep: Chlorhexidine;  Time Out: Verified patient identification, verified procedure, site/side was marked, verified correct patient position, special equipment/implants available, medications/allergies/relevent history reviewed, required imaging and test results available.  Performed  The right femoral head was identified using tactile and fluoroscopic technique.  The right groin was anesthetized with 1% subcutaneous Lidocaine.  The right Common Femoral Artery was accessed using the Modified Seldinger Technique with placement of a antimicrobial bonded/coated single lumen (5 Fr) sheath was placed using the Seldinger technique.  The sheath was aspirated and flushed.  A 5 Fr JL4 Catheter was advanced of over a Standard J wire into the ascending Aorta.  The catheter was used to engage the left coronary artery, however, did not seat well due to calcification and a tortuous arch. The catheter was exchanged for a 157F JL5 catheter which was optimal.  Multiple cineangiographic views of the left coronary artery system(s) were performed.  The right coronary artery was not engaged due to known occlusion. The SVG to OM and PDA were not engaged as they were also known to be occluded. The catheter was then exchanged over a wire for a 157F IM catheter. This was used to selectively engage the LIMA. The LIMA was hand injected and and multiple cineangiographic views were obtained. This catheter was then exchanged over the Standard J wire for an angled Pigtail catheter  that was advanced across the Aortic Valve.  LV hemodynamics were measured.  The catheter was pulled back across the Aortic Valve for measurement of "pull-back"  gradient.  The catheter and the wire was removed completely out of the body.  The patient was transferred to the holding area where the sheath was removed with manual pressure held for hemostasis.   The patient was transported to the cath lab holding area in stable condition.   The patient  was stable before, during and following the procedure.   Patient did tolerate procedure well. There were not complications.  EBL: <10 cc  Medications:  Premedication: 5 mg  Valium  Sedation:  2 mg IV Versed, 25 IV mcg Fentanyl  Contrast:  60 cc Omnipaque  10 mg IV hydralazine  Hemodynamics:  Central Aortic Pressure / Mean Aortic Pressure: 156/74  LV Pressure / LV End diastolic Pressure:  11  Coronary Angiographic Data:  Left Main:  Patent, heavily calcified  Left Anterior Descending (LAD):  There appears to be a 1st generation stent to the proximal LAD with mild in-stent restenosis. The remainder of the LAD proper has mild luminal irregularities and coarses around the apex in usual fashion.  1st diagonal (D1):  There is a high diagonal / ramus branch that appears to run with the LAD and then coarse laterally which is near occluded proximally. There is distal filling of this branch and a sub-branch from a LIMA anastamosis.  Circumflex (LCx):  The is a large vessel which is ostially calcified. There is a proximally placed stent which appears widely patent. Distal TIMI III flow is noted in the vessel. There are collaterals to the right system which are small. There appears to be a vein graft stump segment to the distal LCX which is seen tethering that segment but reverse fills only a centimeter and the remainder of that graft is occluded.  1st obtuse marginal:  Larger branch which appears patent  2nd obtuse marginal:  Smaller branch  Ramus Intermedius:  Suggestion of a smaller ramus branch or high OM branch which is subtotally occluded at the ostium, however, this is a smaller branch.  Right  Coronary Artery: Known flush-occluded and not engaged.  Grafts  LIMA - Diagonal: The LIMA is clearly seen to anastomose to a high diagonal artery which coarses with the LAD but does not reach the apex, there is reverse filling to a sub-branch off of the diagonal up to the ostial which appears calcified and may be occluded.  SVG - LCx:  Known flush-occluded SVG to LCx (anastamosis appears to be in the distal LCX, after a second smaller marginal branch)  SVG - RCA (RPDA/RPL): Known flush-occluded  Impression: 1.  Patent LIMA - diagonal branch (the LAD is not protected) with good distal runoff in that branch.  2.  Known occluded SVG to RCA.  The native RCA is ostially occluded and heavily calcified. 3.  Known occluded SVG to distal LCX.  The proximal LCX is calcified but patent and there is a proximally placed stent which is widely patent with TIMI III flow in the vessel. 4.  Patent proximal LAD stent with mild in-stent restenosis (<20% - appears possibly to be a Palmaz-Shatz stent). 5.  Small left to right hetero-collaterals from the LAD and LCX system.  Plan: 1.  No significant epicardial coronary disease which is amenable to PCI. 2.  I suspect the "ischemia" on his stress test is due to insufficient collaterals, which may be affected by increased  EDP when he exercises or changes in blood pressure.  Overall, I would plan to try to intensify his anti-anginal therapy. 3.  Consider MET-TEST to look at global cardiovascular function as an aid in titrating anti-ischemic medication. 4.  Incidentally noted that the LIMA is connected to a diagonal branch, therefore the proximal LAD stent is unprotected.  The case and results was discussed with the patient (and family). The case and results was not discussed with the patient's PCP. The case and results was not discussed with the patient's Cardiologist.  Time Spend Directly with Patient:  60 minutes  Chrystie Nose, MD, [facility] Attending  Cardiologist [facility]  HILTY,Kenneth C 09/29/2011, 11:02 AM

## 2011-09-29 NOTE — H&P (Signed)
     [facility]          INTERVAL PROCEDURE H&P   History and Physical Interval Note:  09/29/2011 7:53 AM  Gregory [name] has presented today for their planned procedure. The various methods of treatment have been discussed with the patient and family. After consideration of risks, benefits and other options for treatment, the patient has consented to the procedure.  The patients' outpatient history has been reviewed, patient examined, and no change in status from most recent office note within the past 30 days. I have reviewed the patients' chart and labs and will proceed as planned. Questions were answered to the patient's satisfaction.   Chrystie Nose, MD, [facility] Attending Cardiologist [facility]  Gregory [name] 09/29/2011, 7:53 AM

## 2012-02-18 ENCOUNTER — Other Ambulatory Visit (HOSPITAL_COMMUNITY): Payer: Self-pay | Admitting: Cardiovascular Disease

## 2012-02-18 DIAGNOSIS — I714 Abdominal aortic aneurysm, without rupture: Secondary | ICD-10-CM

## 2012-02-18 DIAGNOSIS — R0989 Other specified symptoms and signs involving the circulatory and respiratory systems: Secondary | ICD-10-CM

## 2012-03-17 ENCOUNTER — Ambulatory Visit (HOSPITAL_COMMUNITY)
Admission: RE | Admit: 2012-03-17 | Discharge: 2012-03-17 | Disposition: A | Discharge status: Home / Self Care | Payer: Managed Care, Other (non HMO) | Source: Ambulatory Visit | Attending: Cardiovascular Disease | Admitting: Cardiovascular Disease

## 2012-03-17 DIAGNOSIS — I714 Abdominal aortic aneurysm, without rupture, unspecified: Secondary | ICD-10-CM | POA: Insufficient documentation

## 2012-03-17 DIAGNOSIS — R0989 Other specified symptoms and signs involving the circulatory and respiratory systems: Secondary | ICD-10-CM

## 2012-03-17 NOTE — Progress Notes (Signed)
Carotid Duplex Completed. Linton Stolp D  

## 2012-03-17 NOTE — Progress Notes (Signed)
Aorta Duplex Completed. Lonnie Rosado D  

## 2012-12-12 ENCOUNTER — Encounter: Payer: Self-pay | Admitting: *Deleted

## 2012-12-13 ENCOUNTER — Encounter: Payer: Self-pay | Admitting: Internal Medicine

## 2012-12-14 ENCOUNTER — Encounter: Payer: Self-pay | Admitting: Internal Medicine

## 2012-12-14 ENCOUNTER — Ambulatory Visit (INDEPENDENT_AMBULATORY_CARE_PROVIDER_SITE_OTHER): Payer: Managed Care, Other (non HMO) | Admitting: Internal Medicine

## 2012-12-14 VITALS — BP 136/60 | HR 84 | Ht 77.0 in | Wt 254.0 lb

## 2012-12-14 DIAGNOSIS — I2510 Atherosclerotic heart disease of native coronary artery without angina pectoris: Secondary | ICD-10-CM

## 2012-12-14 DIAGNOSIS — Z951 Presence of aortocoronary bypass graft: Secondary | ICD-10-CM

## 2012-12-14 DIAGNOSIS — E785 Hyperlipidemia, unspecified: Secondary | ICD-10-CM

## 2012-12-14 DIAGNOSIS — E669 Obesity, unspecified: Secondary | ICD-10-CM

## 2012-12-14 DIAGNOSIS — E119 Type 2 diabetes mellitus without complications: Secondary | ICD-10-CM

## 2012-12-14 MED ORDER — NITROGLYCERIN 0.4 MG SL SUBL: 0.4000 mg | SUBLINGUAL_TABLET | SUBLINGUAL | Status: DC | PRN

## 2012-12-14 NOTE — Patient Instructions (Signed)
Your physician wants you to follow-up in:  6 months. You will receive a reminder letter in the mail two months in advance. If you don't receive a letter, please call our office to schedule the follow-up appointment.   

## 2012-12-14 NOTE — Progress Notes (Signed)
OFFICE NOTE  Chief Complaint:  Routine follow-up  Primary Care Physician: Gregory Garbe, MD  HPI:  Gregory [name] is a pleasant 72 year old male with a history of coronary artery disease status post CABG in 2007 in [place]. At that time he underwent a LIMA to a diagonal vessel, and saphenous vein graft to an OM vessel and saphenous vein graft to the PDA. In 2013 he had chest pain and underwent cardiac catheterization by myself. This demonstrated a patent LIMA to LAD, and it was noted that the LAD was not protected. There is known occlusion of the saphenous vein graft to RCA. The saphenous vein graft to the distal circumflex was also occluded. There was a patent proximal LAD stent with mild in-stent restenosis and there were small left to right collaterals from the LAD the circumflex system. Was thought that time his chest pain may be due to insufficient collaterals and that probably explains the abnormalities on his stress test. Greater than 55%, moderate left atrial enlargement, mild tricuspid regurgitation, aortic sclerosis and an RVSP was mildly elevated. Since that time he's been doing very well with medical therapy. He continues to have some musculoskeletal chest pain. He is followed by Gregory [name] and is engaged in a clinical trial for a CETP inhibitor. He's recently had carotid and abdominal Dopplers by Gregory [name] which indicated very mild bilateral carotid stenosis and no abdominal aneurysm. He has diabetes has been well-controlled on Actos and glyburide/metformin.  PMHx:  Past Medical History  Diagnosis Date  . CAD (coronary artery disease)   . 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 Hyperlipidemia   . Exogenous obesity   . S/P CABG x 3 2007  . Musculoskeletal chest pain   . History of nuclear stress test 09/22/2011    post-stress with normal pattern of perfusion; mild inferolateral ischemia unchanged from prior study    Past Surgical History    Procedure Laterality Date  . Coronary angioplasty  1997    intervention to RCA [facility])  . Coronary artery bypass graft  2007    LIMA to diagonal, SVG to OM, SVG to PDA - [facility])  . Coronary angioplasty with stent placement  05/2010    large Promus DES (3.5x35mm) to prox Cfx - known occlusion of SVG to PDA & PLA to OM, prior LAD stent was patent - Gregory [name]   . Replacement total knee    . Intraocular lens insertion  07/2004  . Transthoracic echocardiogram  05/2010    EF=>55%, mild conc LVH, impaired LV relaxation; LA mod dilated; mild mitral annular calcif & trace MR; mild TR & RV systolic pressure elevated 30-19mmHg; AV mildly sclerotic; aortic root sclerosis/calcif  . Cardiac catheterization  09/2011    patent LIMA - diagonal branch (the LAD is not protected) know occluded SVG to RCA & SVG to distal Cfx; Patent proximal LAD stent with mild in-stent restenosis (<20% - appears possibly to be a Palmaz-Shatz stent); Gregory [name]     FAMHx:  Family History  Problem Relation Age of Onset  . Heart failure Mother     also CABG  . Cancer Father     also MI @ 15  . Stroke Brother   . Kidney failure Brother   . Prostate cancer Brother     SOCHx:   reports that he has never smoked. He has never used smokeless tobacco. He reports that he does not drink  alcohol or use illicit drugs.  ALLERGIES:  No Known Allergies  ROS: A comprehensive review of systems was negative except for: Constitutional: positive for weight loss Cardiovascular: positive for chest wall pain  HOME MEDS: Current Outpatient Prescriptions  Medication Sig Dispense Refill  . aspirin EC 81 MG tablet Take 81 mg by mouth daily.      . clopidogrel (PLAVIX) 75 MG tablet Take 75 mg by mouth daily.      Marland Kitchen ezetimibe-simvastatin (VYTORIN) 10-80 MG per tablet Take 1 tablet by mouth at bedtime.      Marland Kitchen glyBURIDE-metformin (GLUCOVANCE) 2.5-500 MG per tablet Take 1-2 tablets by mouth 2 (two) times  daily with a meal. Takes 2 tablets in am and 1 tablet in pm      . losartan (COZAAR) 100 MG tablet Take 1 tablet by mouth daily.      . metoprolol (LOPRESSOR) 100 MG tablet Take 50 mg by mouth 2 (two) times daily.      . Multiple Vitamins-Minerals (CENTRUM SILVER PO) Take 1 tablet by mouth daily.      . nitroGLYCERIN (NITROSTAT) 0.4 MG SL tablet Place 1 tablet (0.4 mg total) under the tongue every 5 (five) minutes as needed for chest pain.  25 tablet  3  . pantoprazole (PROTONIX) 40 MG tablet Take 1 tablet by mouth daily.      . pioglitazone (ACTOS) 45 MG tablet Take 45 mg by mouth daily.       No current facility-administered medications for this visit.    LABS/IMAGING: No results found for this or any previous visit (from the past 48 hour(s)). No results found.  VITALS: BP 136/60  Pulse 84  Ht 6\' 5"  (1.956 m)  Wt 254 lb (115.214 kg)  BMI 30.11 kg/m2  EXAM: General appearance: alert and no distress Neck: no carotid bruit and no JVD Lungs: clear to auscultation bilaterally Heart: regular rate and rhythm, S1, S2 normal, no murmur, click, rub or gallop Abdomen: soft, non-tender; bowel sounds normal; no masses,  no organomegaly Extremities: extremities normal, atraumatic, no cyanosis or edema Pulses: 2+ and symmetric Skin: Skin color, texture, turgor normal. No rashes or lesions Neurologic: Grossly normal Psych: Mood, affect normal  EKG: Sinus rhythm at 84, nonspecific ST and T changes  ASSESSMENT: 1. Coronary artery disease status post three-vessel CABG 2. Patent LIMA to LAD and occluded SVG to OM and SVG to diagonal, with left to right collaterals, patent proximal LAD stent 3. Dyslipidemia-on investigational medications 4. Diabetes type 2-controlled  PLAN: 1.   Mr. Gregory [name] is doing well with current medical regimen for angina and coronary disease. He does have general collaterals and has had no significant ongoing chest pain. Her continue to manage him medically for that. He  has requested additional nitroglycerin as needed today. He continues to be enrolled in a study for abnormal cholesterol and is on Crestor and a study medication. His diabetes is well-controlled and both of which are followed by Gregory [name].  Plan to see him back in 6 months or sooner as necessary.  Chrystie Nose, MD, [facility] Attending Cardiologist [facility]  Boss Danielsen C 12/14/2012, 9:27 AM

## 2013-03-02 ENCOUNTER — Other Ambulatory Visit: Payer: Self-pay | Admitting: *Deleted

## 2013-03-02 MED ORDER — LOSARTAN POTASSIUM 100 MG PO TABS: 100.0000 mg | ORAL_TABLET | Freq: Every day | ORAL | Status: AC

## 2013-05-22 ENCOUNTER — Ambulatory Visit (INDEPENDENT_AMBULATORY_CARE_PROVIDER_SITE_OTHER): Payer: Managed Care, Other (non HMO) | Admitting: Internal Medicine

## 2013-05-22 ENCOUNTER — Encounter: Payer: Self-pay | Admitting: Internal Medicine

## 2013-05-22 VITALS — BP 118/64 | HR 62 | Ht 77.0 in | Wt 252.0 lb

## 2013-05-22 DIAGNOSIS — E119 Type 2 diabetes mellitus without complications: Secondary | ICD-10-CM

## 2013-05-22 DIAGNOSIS — Z951 Presence of aortocoronary bypass graft: Secondary | ICD-10-CM

## 2013-05-22 DIAGNOSIS — E669 Obesity, unspecified: Secondary | ICD-10-CM

## 2013-05-22 DIAGNOSIS — E785 Hyperlipidemia, unspecified: Secondary | ICD-10-CM

## 2013-05-22 DIAGNOSIS — I2510 Atherosclerotic heart disease of native coronary artery without angina pectoris: Secondary | ICD-10-CM | POA: Insufficient documentation

## 2013-05-22 NOTE — Patient Instructions (Signed)
Dr Hilty wants you to follow-up in 1 year. You will receive a reminder letter in the mail one months in advance. If you don't receive a letter, please call our office to schedule the follow-up appointment. 

## 2013-05-22 NOTE — Progress Notes (Signed)
OFFICE NOTE  Chief Complaint:  Routine follow-up  Primary Care Physician: Haywood Pao, MD  HPI:  Gregory [name] is a pleasant 73 year old male with a history of coronary artery disease status post CABG in 2007 in [place]. At that time he underwent a LIMA to a diagonal vessel, and saphenous vein graft to an OM vessel and saphenous vein graft to the PDA. In 2013 he had chest pain and underwent cardiac catheterization by myself. This demonstrated a patent LIMA to LAD, and it was noted that the LAD was not protected. There is known occlusion of the saphenous vein graft to RCA. The saphenous vein graft to the distal circumflex was also occluded. There was a patent proximal LAD stent with mild in-stent restenosis and there were small left to right collaterals from the LAD the circumflex system. Was thought that time his chest pain may be due to insufficient collaterals and that probably explains the abnormalities on his stress test. Greater than 55%, moderate left atrial enlargement, mild tricuspid regurgitation, aortic sclerosis and an RVSP was mildly elevated. Since that time he's been doing very well with medical therapy. He continues to have some musculoskeletal chest pain. He is followed by Dr. Osborne Casco and is engaged in a clinical trial for a CETP inhibitor. He's recently had carotid and abdominal Dopplers by Dr. Rollene Fare which indicated very mild bilateral carotid stenosis and no abdominal aneurysm. He has diabetes has been well-controlled on Actos and glyburide/metformin.  Gregory [name] returns today and is feeling well. He has no real new complaints. A review of laboratory work from June of 2014 shows a total cholesterol of 127, triglycerides 85, HDL 72 and LDL 38. Hemoglobin A1c remains elevated at 7.4.  Of note, he is enrolled in a clinical trial which I believe is for low HDL ( sounds like a CETP inhibitor trial.)  PMHx:  Past Medical History  Diagnosis Date  . CAD (coronary artery  disease)   . 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 Hyperlipidemia   . Exogenous obesity   . S/P CABG x 3 2007  . Musculoskeletal chest pain   . History of nuclear stress test 09/22/2011    post-stress with normal pattern of perfusion; mild inferolateral ischemia unchanged from prior study    Past Surgical History  Procedure Laterality Date  . Coronary angioplasty  1997    intervention to RCA [facility])  . Coronary artery bypass graft  2007    LIMA to diagonal, SVG to OM, SVG to PDA - [facility])  . Coronary angioplasty with stent placement  05/2010    large Promus DES (3.5x90mm) to prox Cfx - known occlusion of SVG to PDA & PLA to OM, prior LAD stent was patent - Dr. Chase Picket   . Replacement total knee    . Intraocular lens insertion  07/2004  . Transthoracic echocardiogram  05/2010    EF=>55%, mild conc LVH, impaired LV relaxation; LA mod dilated; mild mitral annular calcif & trace MR; mild TR & RV systolic pressure elevated 30-15mmHg; AV mildly sclerotic; aortic root sclerosis/calcif  . Cardiac catheterization  09/2011    patent LIMA - diagonal branch (the LAD is not protected) know occluded SVG to RCA & SVG to distal Cfx; Patent proximal LAD stent with mild in-stent restenosis (<20% - appears possibly to be a Palmaz-Shatz stent); Dr. Kathy Breach     FAMHx:  Family History  Problem Relation Age  of Onset  . Heart failure Mother     also CABG  . Cancer Father     also MI @ 12  . Stroke Brother   . Kidney failure Brother   . Prostate cancer Brother     SOCHx:   reports that he has never smoked. He has never used smokeless tobacco. He reports that he does not drink alcohol or use illicit drugs.  ALLERGIES:  No Known Allergies  ROS: A comprehensive review of systems was negative except for: Constitutional: positive for weight loss Cardiovascular: positive for chest wall pain  HOME MEDS: Current Outpatient Prescriptions    Medication Sig Dispense Refill  . aspirin EC 81 MG tablet Take 81 mg by mouth daily.      . clopidogrel (PLAVIX) 75 MG tablet Take 75 mg by mouth daily.      Marland Kitchen ezetimibe-simvastatin (VYTORIN) 10-80 MG per tablet Take 1 tablet by mouth at bedtime.      Marland Kitchen glyBURIDE-metformin (GLUCOVANCE) 2.5-500 MG per tablet Take 1-2 tablets by mouth 2 (two) times daily with a meal. Takes 2 tablets in am and 1 tablet in pm      . losartan (COZAAR) 100 MG tablet Take 1 tablet (100 mg total) by mouth daily.  90 tablet  3  . metoprolol (LOPRESSOR) 100 MG tablet Take 50 mg by mouth 2 (two) times daily.      . Multiple Vitamins-Minerals (CENTRUM SILVER PO) Take 1 tablet by mouth daily.      . nitroGLYCERIN (NITROSTAT) 0.4 MG SL tablet Place 1 tablet (0.4 mg total) under the tongue every 5 (five) minutes as needed for chest pain.  25 tablet  3  . pantoprazole (PROTONIX) 40 MG tablet Take 1 tablet by mouth daily.      . pioglitazone (ACTOS) 45 MG tablet Take 45 mg by mouth daily.       No current facility-administered medications for this visit.    LABS/IMAGING: No results found for this or any previous visit (from the past 48 hour(s)). No results found.  VITALS: BP 118/64  Pulse 62  Ht 6\' 5"  (1.956 m)  Wt 252 lb (114.306 kg)  BMI 29.88 kg/m2  EXAM: General appearance: alert and no distress Neck: no carotid bruit and no JVD Lungs: clear to auscultation bilaterally Heart: regular rate and rhythm, S1, S2 normal, no murmur, click, rub or gallop Abdomen: soft, non-tender; bowel sounds normal; no masses,  no organomegaly Extremities: extremities normal, atraumatic, no cyanosis or edema Pulses: 2+ and symmetric Skin: Skin color, texture, turgor normal. No rashes or lesions Neurologic: Grossly normal Psych: Mood, affect normal  EKG: Sinus rhythm at 62, nonspecific ST and T changes  ASSESSMENT: 1. Coronary artery disease status post three-vessel CABG 2. Patent LIMA to LAD and occluded SVG to OM and SVG  to diagonal, with left to right collaterals, patent proximal LAD stent 3. Dyslipidemia-on investigational medications 4. Diabetes type 2-controlled  PLAN: 1.   Gregory [name] is doing well with current medical regimen for angina and coronary disease. He does have general collaterals and has had no significant ongoing chest pain. Her continue to manage him medically for that. He has not required additional nitroglycerin. His cholesterol appears to be well controlled on the combination of Vytorin and his investigational medication-or placebo.  Pixie Casino, MD, [facility] Attending Cardiologist [facility]  HILTY,Kenneth C 05/22/2013, 8:33 PM

## 2013-08-08 ENCOUNTER — Encounter: Payer: Self-pay | Admitting: Internal Medicine

## 2013-11-11 IMAGING — CR DG CHEST 2V
2 series · 2 of 2 positions shown · non-contrast
Comparison: None.

CLINICAL DATA: Chest pain, preop for cardiac catheterization

CHEST - 2 VIEW

[w chest pa]
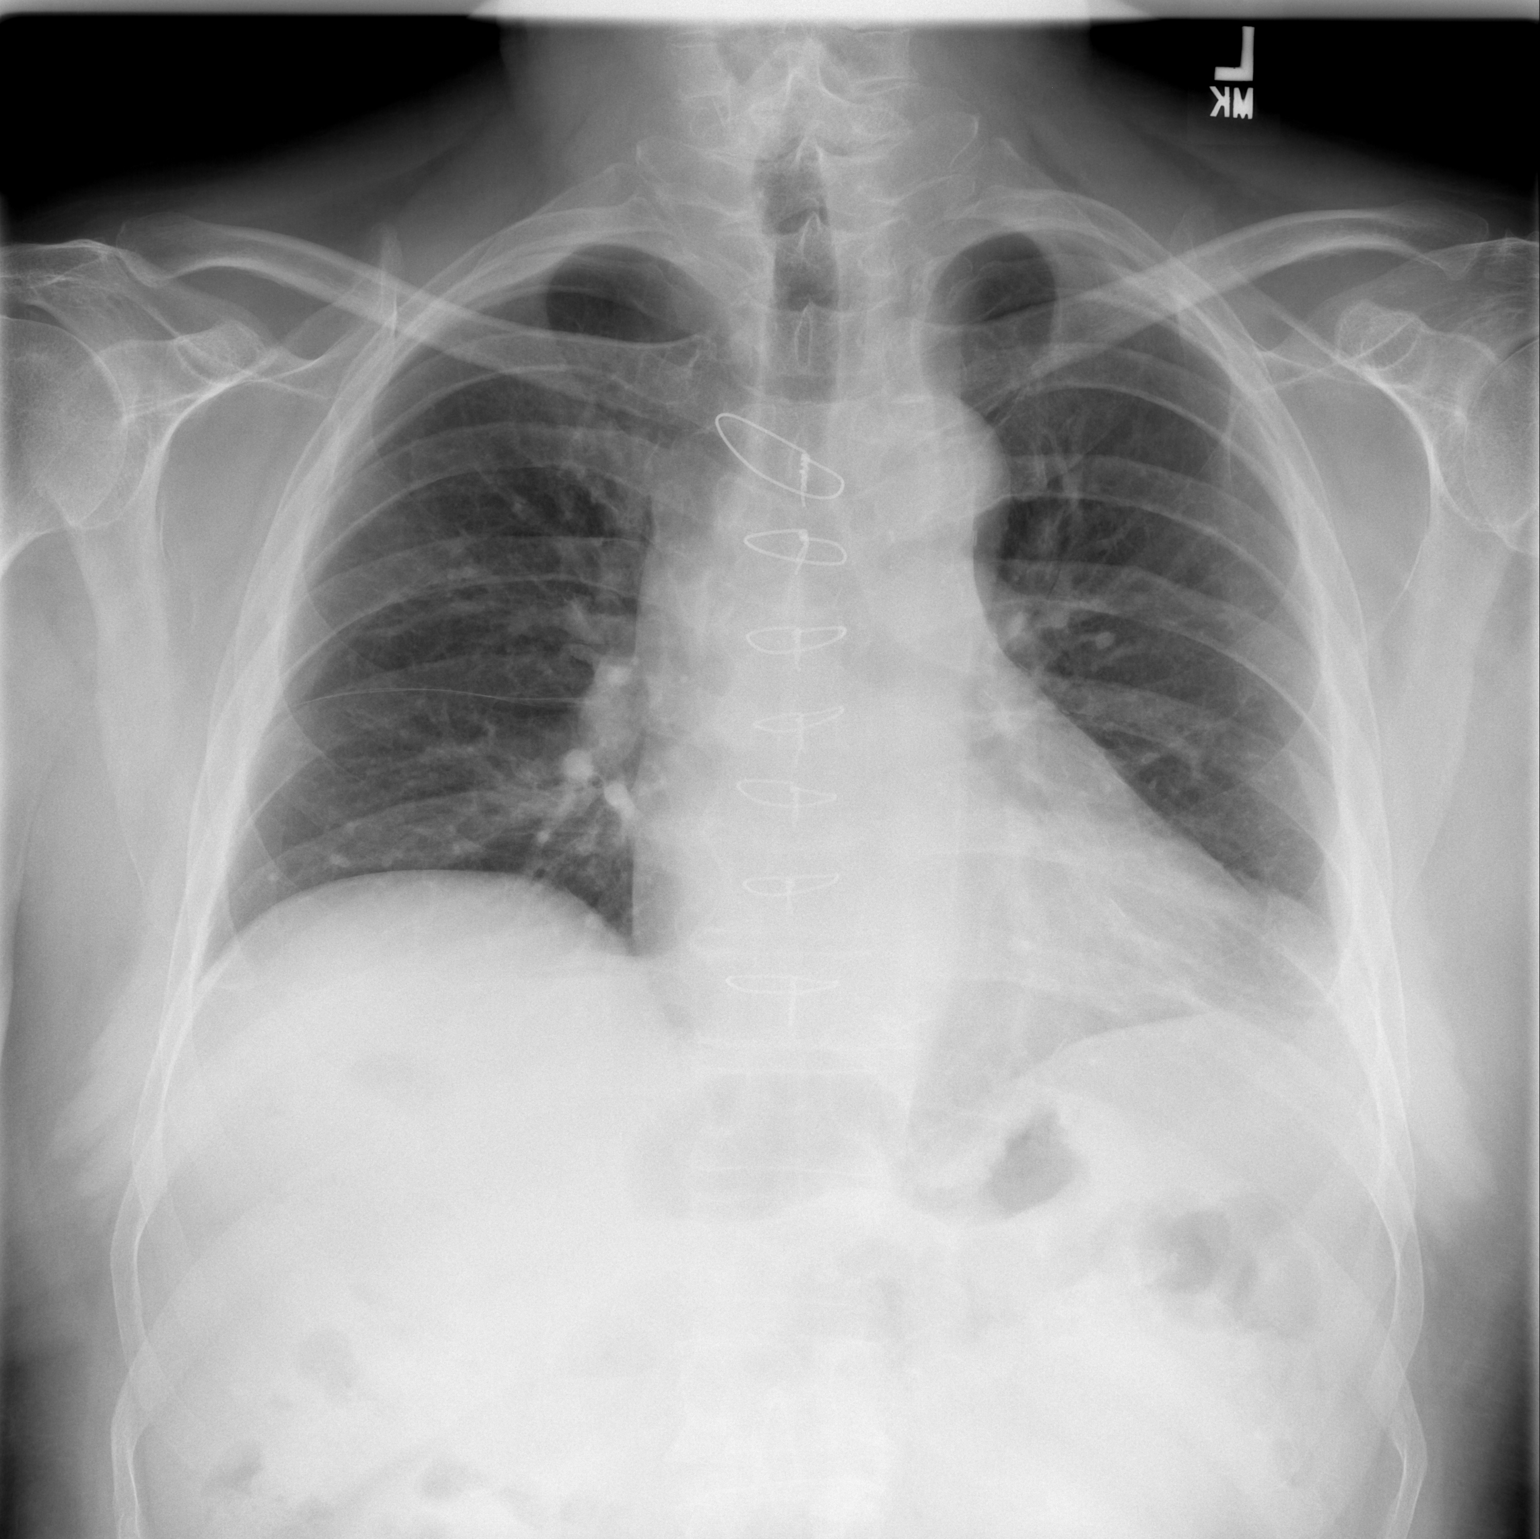

[w chest lat]
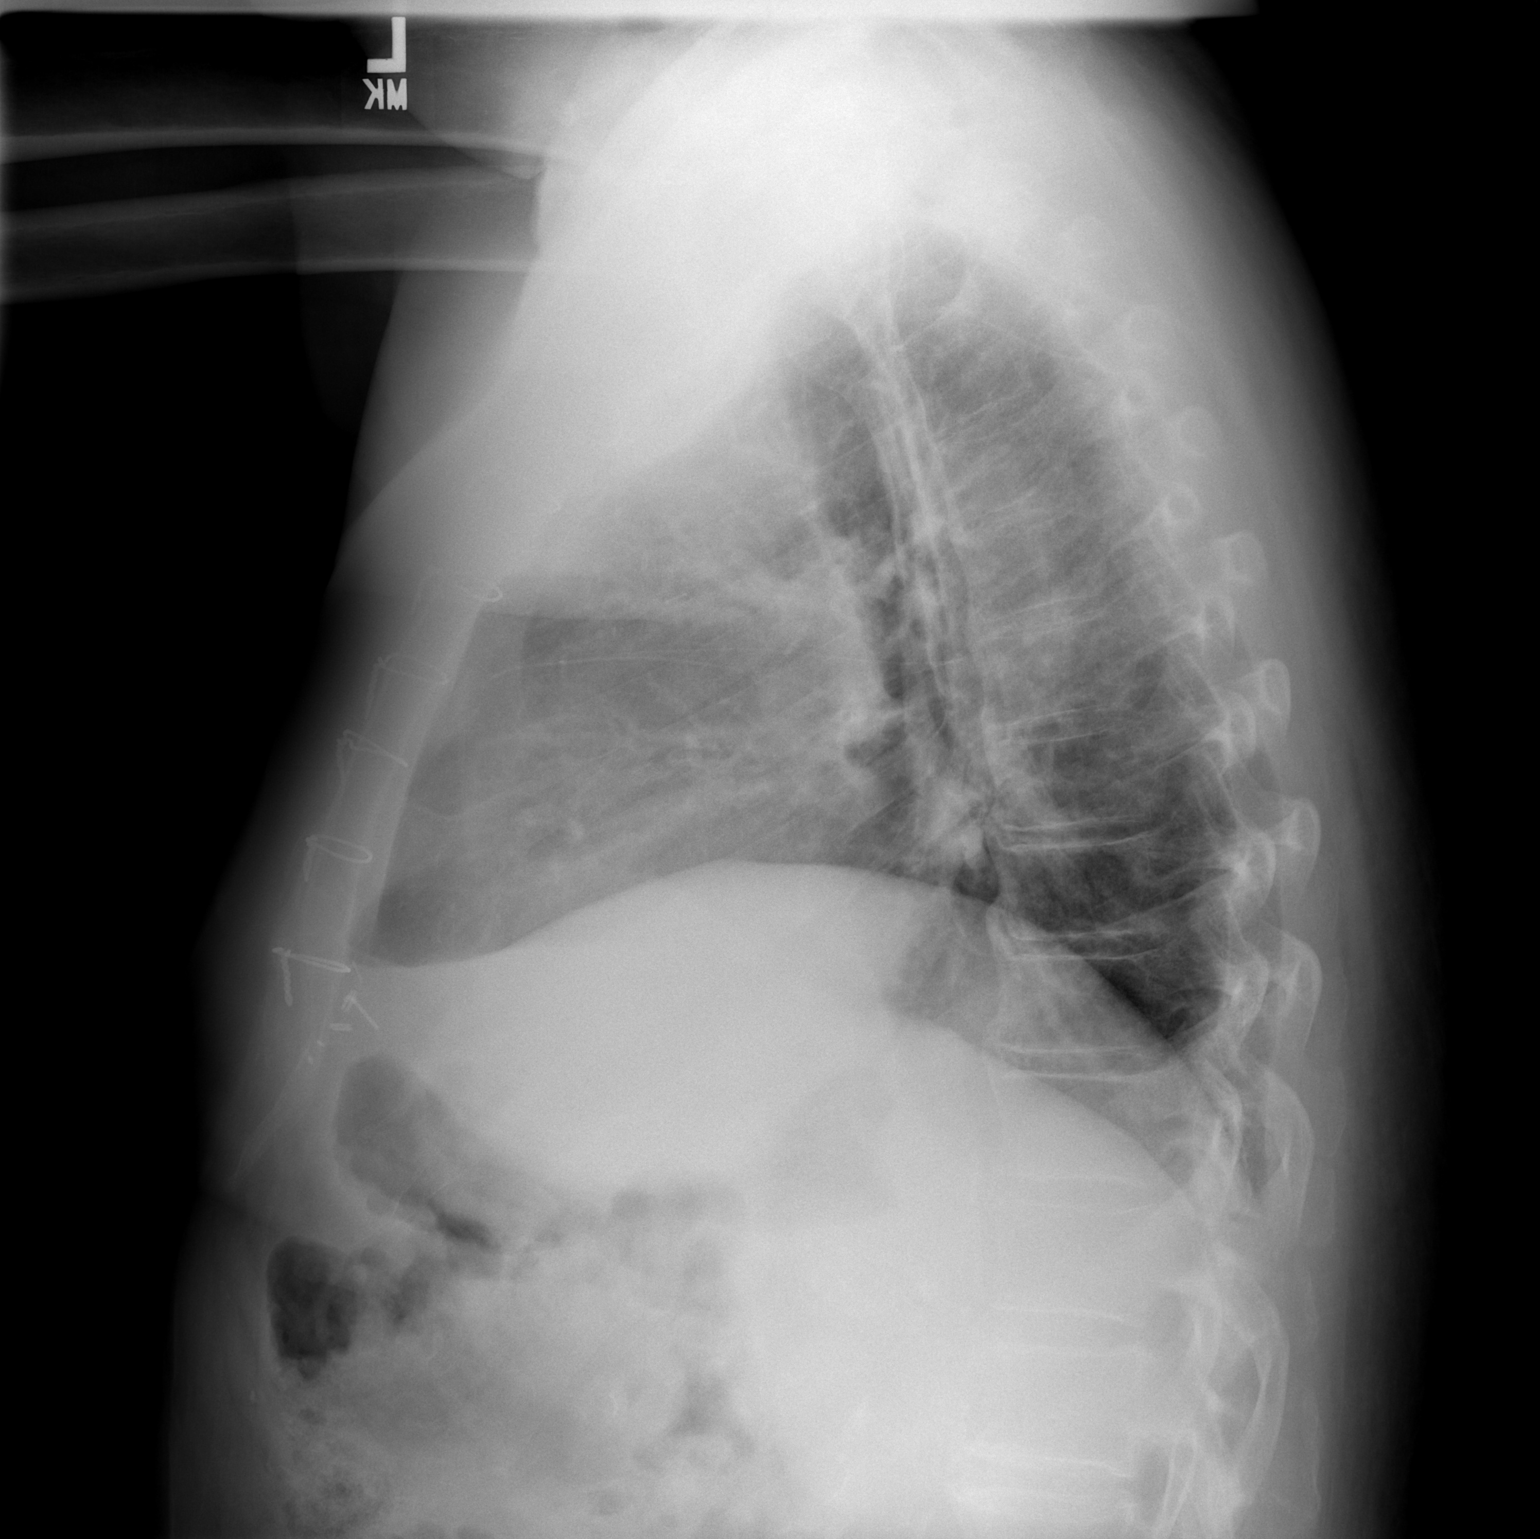

[2 of 2 positions shown; findings below may reference images not displayed]

FINDINGS: The lungs are not optimally aerated.  No focal infiltrate
or effusion is seen.  The heart is mildly enlarged.  Median
sternotomy sutures are noted.  There are degenerative changes
throughout the thoracic spine.
IMPRESSION: Suboptimal inspiration.  No active lung disease.

## 2014-01-24 ENCOUNTER — Encounter (HOSPITAL_COMMUNITY): Payer: Self-pay | Admitting: Internal Medicine

## 2014-06-14 ENCOUNTER — Ambulatory Visit (INDEPENDENT_AMBULATORY_CARE_PROVIDER_SITE_OTHER): Payer: Managed Care, Other (non HMO) | Admitting: Internal Medicine

## 2014-06-14 ENCOUNTER — Telehealth: Payer: Self-pay | Admitting: Internal Medicine

## 2014-06-14 VITALS — BP 138/62 | HR 72 | Ht 77.0 in | Wt 252.7 lb

## 2014-06-14 DIAGNOSIS — E785 Hyperlipidemia, unspecified: Secondary | ICD-10-CM | POA: Diagnosis not present

## 2014-06-14 DIAGNOSIS — I2510 Atherosclerotic heart disease of native coronary artery without angina pectoris: Secondary | ICD-10-CM

## 2014-06-14 DIAGNOSIS — I2583 Coronary atherosclerosis due to lipid rich plaque: Secondary | ICD-10-CM

## 2014-06-14 DIAGNOSIS — Z951 Presence of aortocoronary bypass graft: Secondary | ICD-10-CM | POA: Diagnosis not present

## 2014-06-14 DIAGNOSIS — E669 Obesity, unspecified: Secondary | ICD-10-CM | POA: Diagnosis not present

## 2014-06-14 DIAGNOSIS — E119 Type 2 diabetes mellitus without complications: Secondary | ICD-10-CM | POA: Diagnosis not present

## 2014-06-14 MED ORDER — NITROGLYCERIN 0.4 MG SL SUBL: 0.4000 mg | SUBLINGUAL_TABLET | SUBLINGUAL | Status: DC | PRN

## 2014-06-14 NOTE — Patient Instructions (Signed)
STOP Vytorin for 2 weeks. Once the two weeks are up, call the office and let us know how you are feeling as far as chest pain goes.   Dr.Hilty wants you to follow-up in: ONE YEAR. You will receive a reminder letter in the mail two months in advance. If you don't receive a letter, please call our office to schedule the follow-up appointment.

## 2014-06-14 NOTE — Telephone Encounter (Signed)
°  1. Which medications need to be refilled? Nitrostat  2. Which pharmacy is medication to be sent to?[facility] [address]  3. Do they need a 30 day or 90 day supply? 30(prn)  4. Would they like a call back once the medication has been sent to the pharmacy? Yes

## 2014-06-14 NOTE — Telephone Encounter (Signed)
Rx refill sent to patient pharmacy   

## 2014-06-16 ENCOUNTER — Encounter: Payer: Self-pay | Admitting: Internal Medicine

## 2014-06-16 NOTE — Progress Notes (Signed)
OFFICE NOTE  Chief Complaint:  Routine follow-up  Primary Care Physician: Haywood Pao, MD  HPI:  Tajee Savant is a pleasant 74 year old male with a history of coronary artery disease status post CABG in 2007 in [place]. At that time he underwent a LIMA to a diagonal vessel, and saphenous vein graft to an OM vessel and saphenous vein graft to the PDA. In 2013 he had chest pain and underwent cardiac catheterization by myself. This demonstrated a patent LIMA to LAD, and it was noted that the LAD was not protected. There is known occlusion of the saphenous vein graft to RCA. The saphenous vein graft to the distal circumflex was also occluded. There was a patent proximal LAD stent with mild in-stent restenosis and there were small left to right collaterals from the LAD the circumflex system. Was thought that time his chest pain may be due to insufficient collaterals and that probably explains the abnormalities on his stress test. Greater than 55%, moderate left atrial enlargement, mild tricuspid regurgitation, aortic sclerosis and an RVSP was mildly elevated. Since that time he's been doing very well with medical therapy. He continues to have some musculoskeletal chest pain. He is followed by Dr. Osborne Casco and is engaged in a clinical trial for a CETP inhibitor. He's recently had carotid and abdominal Dopplers by Dr. Rollene Fare which indicated very mild bilateral carotid stenosis and no abdominal aneurysm. He has diabetes has been well-controlled on Actos and glyburide/metformin.  Mr. Hutzler returns today and is feeling well. He has no real new complaints. A review of laboratory work from June of 2014 shows a total cholesterol of 127, triglycerides 85, HDL 72 and LDL 38. Hemoglobin A1c remains elevated at 7.4.  Of note, he is enrolled in a clinical trial which I believe is for low HDL ( sounds like a CETP inhibitor trial).  I saw Mr. Ralph Leyden back in the office today. He can have some mild chest  discomfort which he describes as a soreness. This could be related to his cholesterol medications. He completed the CTP inhibitor trial and apparently was getting medication due to significant increase in HDL. However unfortunately the trial was negative with regards to improved outcomes.  PMHx:  Past Medical History  Diagnosis Date  . CAD (coronary artery disease)   . 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 Hyperlipidemia   . Exogenous obesity   . S/P CABG x 3 2007  . Musculoskeletal chest pain   . History of nuclear stress test 09/22/2011    post-stress with normal pattern of perfusion; mild inferolateral ischemia unchanged from prior study    Past Surgical History  Procedure Laterality Date  . Coronary angioplasty  1997    intervention to RCA [facility])  . Coronary artery bypass graft  2007    LIMA to diagonal, SVG to OM, SVG to PDA - [facility])  . Coronary angioplasty with stent placement  05/2010    large Promus DES (3.5x52mm) to prox Cfx - known occlusion of SVG to PDA & PLA to OM, prior LAD stent was patent - Dr. Chase Picket   . Replacement total knee    . Intraocular lens insertion  07/2004  . Transthoracic echocardiogram  05/2010    EF=>55%, mild conc LVH, impaired LV relaxation; LA mod dilated; mild mitral annular calcif & trace MR; mild TR & RV systolic pressure elevated 30-18mmHg; AV mildly sclerotic; aortic root sclerosis/calcif  .  Cardiac catheterization  09/2011    patent LIMA - diagonal branch (the LAD is not protected) know occluded SVG to RCA & SVG to distal Cfx; Patent proximal LAD stent with mild in-stent restenosis (<20% - appears possibly to be a Palmaz-Shatz stent); Dr. Kathy Breach   . Left heart catheterization with coronary/graft angiogram N/A 09/29/2011    Procedure: LEFT HEART CATHETERIZATION WITH Beatrix Fetters;  Surgeon: Pixie Casino, MD;  Location: [facility] CATH LAB;  Service: Cardiovascular;  Laterality:  N/A;    FAMHx:  Family History  Problem Relation Age of Onset  . Heart failure Mother     also CABG  . Cancer Father     also MI @ 57  . Stroke Brother   . Kidney failure Brother   . Prostate cancer Brother     SOCHx:   reports that he has never smoked. He has never used smokeless tobacco. He reports that he does not drink alcohol or use illicit drugs.  ALLERGIES:  No Known Allergies  ROS: A comprehensive review of systems was negative except for: Constitutional: positive for weight loss Cardiovascular: positive for chest wall pain  HOME MEDS: Current Outpatient Prescriptions  Medication Sig Dispense Refill  . aspirin EC 81 MG tablet Take 81 mg by mouth daily.    . clopidogrel (PLAVIX) 75 MG tablet Take 75 mg by mouth daily.    Marland Kitchen ezetimibe-simvastatin (VYTORIN) 10-80 MG per tablet Take 1 tablet by mouth at bedtime.    Marland Kitchen glyBURIDE-metformin (GLUCOVANCE) 2.5-500 MG per tablet Take 1-2 tablets by mouth 2 (two) times daily with a meal. Takes 2 tablets in am and 1 tablet in pm    . losartan (COZAAR) 100 MG tablet Take 1 tablet (100 mg total) by mouth daily. 90 tablet 3  . metoprolol (LOPRESSOR) 100 MG tablet Take 50 mg by mouth 2 (two) times daily.    . Multiple Vitamins-Minerals (CENTRUM SILVER PO) Take 1 tablet by mouth daily.    . pantoprazole (PROTONIX) 40 MG tablet Take 1 tablet by mouth daily.    . pioglitazone (ACTOS) 45 MG tablet Take 45 mg by mouth daily.    . nitroGLYCERIN (NITROSTAT) 0.4 MG SL tablet Place 1 tablet (0.4 mg total) under the tongue every 5 (five) minutes as needed for chest pain. 25 tablet 3   No current facility-administered medications for this visit.    LABS/IMAGING: No results found for this or any previous visit (from the past 48 hour(s)). No results found.  VITALS: BP 138/62 mmHg  Pulse 72  Ht 6\' 5"  (1.956 m)  Wt 252 lb 11.2 oz (114.624 kg)  BMI 29.96 kg/m2  EXAM: General appearance: alert and no distress Neck: no carotid bruit and no  JVD Lungs: clear to auscultation bilaterally Heart: regular rate and rhythm, S1, S2 normal, no murmur, click, rub or gallop Abdomen: soft, non-tender; bowel sounds normal; no masses,  no organomegaly Extremities: extremities normal, atraumatic, no cyanosis or edema Pulses: 2+ and symmetric Skin: Skin color, texture, turgor normal. No rashes or lesions Neurologic: Grossly normal Psych: Mood, affect normal  EKG: Sinus rhythm at 72  ASSESSMENT: 1. Coronary artery disease status post three-vessel CABG 2. Patent LIMA to LAD and occluded SVG to OM and SVG to diagonal, with left to right collaterals, patent proximal LAD stent 3. Dyslipidemia-on Vytorin possibly with myalgias 4. Diabetes type 2-controlled  PLAN: 1.   Mr. Ralph Leyden is doing well with current medical regimen for angina and coronary disease. He does have  some chronic chest wall pain which may be related to his statin medications. His LDL is quite low. I recommended 2 weeks statin holiday off of Vytorin to see if his symptoms improve. We may need to consider other options if it's related to this. He has finished his investigational medication to raise HDL.  Otherwise plan to see him back annually.  Pixie Casino, MD, [facility] Attending Cardiologist [place], [place] 06/16/2014, 3:23 PM

## 2016-06-04 DIAGNOSIS — L570 Actinic keratosis: Secondary | ICD-10-CM | POA: Diagnosis not present

## 2016-06-04 DIAGNOSIS — L853 Xerosis cutis: Secondary | ICD-10-CM | POA: Diagnosis not present

## 2016-06-04 DIAGNOSIS — L918 Other hypertrophic disorders of the skin: Secondary | ICD-10-CM | POA: Diagnosis not present

## 2016-06-04 DIAGNOSIS — L821 Other seborrheic keratosis: Secondary | ICD-10-CM | POA: Diagnosis not present

## 2016-07-02 ENCOUNTER — Other Ambulatory Visit: Payer: Self-pay | Admitting: Internal Medicine

## 2016-07-05 ENCOUNTER — Other Ambulatory Visit: Payer: Self-pay | Admitting: Internal Medicine

## 2016-07-05 NOTE — Telephone Encounter (Signed)
Rx request sent to pharmacy.  

## 2016-08-23 DIAGNOSIS — Z125 Encounter for screening for malignant neoplasm of prostate: Secondary | ICD-10-CM | POA: Diagnosis not present

## 2016-08-23 DIAGNOSIS — E7800 Pure hypercholesterolemia, unspecified: Secondary | ICD-10-CM | POA: Diagnosis not present

## 2016-08-23 DIAGNOSIS — E11319 Type 2 diabetes mellitus with unspecified diabetic retinopathy without macular edema: Secondary | ICD-10-CM | POA: Diagnosis not present

## 2016-08-23 DIAGNOSIS — N182 Chronic kidney disease, stage 2 (mild): Secondary | ICD-10-CM | POA: Diagnosis not present

## 2016-08-26 DIAGNOSIS — E7800 Pure hypercholesterolemia, unspecified: Secondary | ICD-10-CM | POA: Diagnosis not present

## 2016-08-26 DIAGNOSIS — I1310 Hypertensive heart and chronic kidney disease without heart failure, with stage 1 through stage 4 chronic kidney disease, or unspecified chronic kidney disease: Secondary | ICD-10-CM | POA: Diagnosis not present

## 2016-08-26 DIAGNOSIS — G894 Chronic pain syndrome: Secondary | ICD-10-CM | POA: Diagnosis not present

## 2016-08-26 DIAGNOSIS — N183 Chronic kidney disease, stage 3 (moderate): Secondary | ICD-10-CM | POA: Diagnosis not present

## 2016-08-26 DIAGNOSIS — E668 Other obesity: Secondary | ICD-10-CM | POA: Diagnosis not present

## 2016-08-26 DIAGNOSIS — I25810 Atherosclerosis of coronary artery bypass graft(s) without angina pectoris: Secondary | ICD-10-CM | POA: Diagnosis not present

## 2016-08-26 DIAGNOSIS — D692 Other nonthrombocytopenic purpura: Secondary | ICD-10-CM | POA: Diagnosis not present

## 2016-08-26 DIAGNOSIS — Z0000 Encounter for general adult medical examination without abnormal findings: Secondary | ICD-10-CM | POA: Diagnosis not present

## 2016-08-26 DIAGNOSIS — E11319 Type 2 diabetes mellitus with unspecified diabetic retinopathy without macular edema: Secondary | ICD-10-CM | POA: Diagnosis not present

## 2016-08-26 DIAGNOSIS — E113293 Type 2 diabetes mellitus with mild nonproliferative diabetic retinopathy without macular edema, bilateral: Secondary | ICD-10-CM | POA: Diagnosis not present

## 2016-08-27 DIAGNOSIS — Z1212 Encounter for screening for malignant neoplasm of rectum: Secondary | ICD-10-CM | POA: Diagnosis not present

## 2016-10-05 DIAGNOSIS — L570 Actinic keratosis: Secondary | ICD-10-CM | POA: Diagnosis not present

## 2016-10-26 DIAGNOSIS — R69 Illness, unspecified: Secondary | ICD-10-CM | POA: Diagnosis not present

## 2016-11-13 DIAGNOSIS — Z23 Encounter for immunization: Secondary | ICD-10-CM | POA: Diagnosis not present

## 2016-12-22 DIAGNOSIS — R05 Cough: Secondary | ICD-10-CM | POA: Diagnosis not present

## 2016-12-22 DIAGNOSIS — Z6828 Body mass index (BMI) 28.0-28.9, adult: Secondary | ICD-10-CM | POA: Diagnosis not present

## 2017-04-20 DIAGNOSIS — H524 Presbyopia: Secondary | ICD-10-CM | POA: Diagnosis not present

## 2017-04-20 DIAGNOSIS — E119 Type 2 diabetes mellitus without complications: Secondary | ICD-10-CM | POA: Diagnosis not present

## 2017-05-20 DIAGNOSIS — I788 Other diseases of capillaries: Secondary | ICD-10-CM | POA: Diagnosis not present

## 2017-05-20 DIAGNOSIS — L570 Actinic keratosis: Secondary | ICD-10-CM | POA: Diagnosis not present

## 2017-05-20 DIAGNOSIS — L821 Other seborrheic keratosis: Secondary | ICD-10-CM | POA: Diagnosis not present

## 2017-05-20 DIAGNOSIS — D225 Melanocytic nevi of trunk: Secondary | ICD-10-CM | POA: Diagnosis not present

## 2017-05-23 DIAGNOSIS — R69 Illness, unspecified: Secondary | ICD-10-CM | POA: Diagnosis not present

## 2017-08-30 DIAGNOSIS — E1129 Type 2 diabetes mellitus with other diabetic kidney complication: Secondary | ICD-10-CM | POA: Diagnosis not present

## 2017-08-30 DIAGNOSIS — R82998 Other abnormal findings in urine: Secondary | ICD-10-CM | POA: Diagnosis not present

## 2017-08-30 DIAGNOSIS — E7800 Pure hypercholesterolemia, unspecified: Secondary | ICD-10-CM | POA: Diagnosis not present

## 2017-08-30 DIAGNOSIS — I10 Essential (primary) hypertension: Secondary | ICD-10-CM | POA: Diagnosis not present

## 2017-08-30 DIAGNOSIS — Z125 Encounter for screening for malignant neoplasm of prostate: Secondary | ICD-10-CM | POA: Diagnosis not present

## 2017-09-06 DIAGNOSIS — E1129 Type 2 diabetes mellitus with other diabetic kidney complication: Secondary | ICD-10-CM | POA: Diagnosis not present

## 2017-09-06 DIAGNOSIS — I1310 Hypertensive heart and chronic kidney disease without heart failure, with stage 1 through stage 4 chronic kidney disease, or unspecified chronic kidney disease: Secondary | ICD-10-CM | POA: Diagnosis not present

## 2017-09-06 DIAGNOSIS — E1151 Type 2 diabetes mellitus with diabetic peripheral angiopathy without gangrene: Secondary | ICD-10-CM | POA: Diagnosis not present

## 2017-09-06 DIAGNOSIS — Z1212 Encounter for screening for malignant neoplasm of rectum: Secondary | ICD-10-CM | POA: Diagnosis not present

## 2017-09-06 DIAGNOSIS — G894 Chronic pain syndrome: Secondary | ICD-10-CM | POA: Diagnosis not present

## 2017-09-06 DIAGNOSIS — Z0000 Encounter for general adult medical examination without abnormal findings: Secondary | ICD-10-CM | POA: Diagnosis not present

## 2017-09-06 DIAGNOSIS — K219 Gastro-esophageal reflux disease without esophagitis: Secondary | ICD-10-CM | POA: Diagnosis not present

## 2017-09-06 DIAGNOSIS — E7800 Pure hypercholesterolemia, unspecified: Secondary | ICD-10-CM | POA: Diagnosis not present

## 2017-09-06 DIAGNOSIS — E11319 Type 2 diabetes mellitus with unspecified diabetic retinopathy without macular edema: Secondary | ICD-10-CM | POA: Diagnosis not present

## 2017-09-06 DIAGNOSIS — I10 Essential (primary) hypertension: Secondary | ICD-10-CM | POA: Diagnosis not present

## 2017-09-06 DIAGNOSIS — N182 Chronic kidney disease, stage 2 (mild): Secondary | ICD-10-CM | POA: Diagnosis not present

## 2017-11-22 DIAGNOSIS — R69 Illness, unspecified: Secondary | ICD-10-CM | POA: Diagnosis not present

## 2017-12-27 DIAGNOSIS — Z23 Encounter for immunization: Secondary | ICD-10-CM | POA: Diagnosis not present

## 2018-02-20 DIAGNOSIS — Z6830 Body mass index (BMI) 30.0-30.9, adult: Secondary | ICD-10-CM | POA: Diagnosis not present

## 2018-02-20 DIAGNOSIS — W1831XA Fall on same level due to stepping on an object, initial encounter: Secondary | ICD-10-CM | POA: Diagnosis not present

## 2018-02-20 DIAGNOSIS — G894 Chronic pain syndrome: Secondary | ICD-10-CM | POA: Diagnosis not present

## 2018-02-20 DIAGNOSIS — R0782 Intercostal pain: Secondary | ICD-10-CM | POA: Diagnosis not present

## 2018-05-31 DIAGNOSIS — D485 Neoplasm of uncertain behavior of skin: Secondary | ICD-10-CM | POA: Diagnosis not present

## 2018-05-31 DIAGNOSIS — D0439 Carcinoma in situ of skin of other parts of face: Secondary | ICD-10-CM | POA: Diagnosis not present

## 2018-05-31 DIAGNOSIS — L821 Other seborrheic keratosis: Secondary | ICD-10-CM | POA: Diagnosis not present

## 2018-05-31 DIAGNOSIS — C44519 Basal cell carcinoma of skin of other part of trunk: Secondary | ICD-10-CM | POA: Diagnosis not present

## 2018-05-31 DIAGNOSIS — L570 Actinic keratosis: Secondary | ICD-10-CM | POA: Diagnosis not present

## 2018-05-31 DIAGNOSIS — D1801 Hemangioma of skin and subcutaneous tissue: Secondary | ICD-10-CM | POA: Diagnosis not present

## 2018-06-12 DIAGNOSIS — C44519 Basal cell carcinoma of skin of other part of trunk: Secondary | ICD-10-CM | POA: Diagnosis not present

## 2018-06-12 DIAGNOSIS — C44321 Squamous cell carcinoma of skin of nose: Secondary | ICD-10-CM | POA: Diagnosis not present

## 2018-06-12 DIAGNOSIS — Z85828 Personal history of other malignant neoplasm of skin: Secondary | ICD-10-CM | POA: Diagnosis not present

## 2018-08-24 DIAGNOSIS — E119 Type 2 diabetes mellitus without complications: Secondary | ICD-10-CM | POA: Diagnosis not present

## 2018-09-06 DIAGNOSIS — E7800 Pure hypercholesterolemia, unspecified: Secondary | ICD-10-CM | POA: Diagnosis not present

## 2018-09-06 DIAGNOSIS — E11319 Type 2 diabetes mellitus with unspecified diabetic retinopathy without macular edema: Secondary | ICD-10-CM | POA: Diagnosis not present

## 2018-09-06 DIAGNOSIS — R82998 Other abnormal findings in urine: Secondary | ICD-10-CM | POA: Diagnosis not present

## 2018-09-06 DIAGNOSIS — I1310 Hypertensive heart and chronic kidney disease without heart failure, with stage 1 through stage 4 chronic kidney disease, or unspecified chronic kidney disease: Secondary | ICD-10-CM | POA: Diagnosis not present

## 2018-09-06 DIAGNOSIS — Z125 Encounter for screening for malignant neoplasm of prostate: Secondary | ICD-10-CM | POA: Diagnosis not present

## 2018-09-13 DIAGNOSIS — G894 Chronic pain syndrome: Secondary | ICD-10-CM | POA: Diagnosis not present

## 2018-09-13 DIAGNOSIS — Z0000 Encounter for general adult medical examination without abnormal findings: Secondary | ICD-10-CM | POA: Diagnosis not present

## 2018-09-13 DIAGNOSIS — E7800 Pure hypercholesterolemia, unspecified: Secondary | ICD-10-CM | POA: Diagnosis not present

## 2018-09-13 DIAGNOSIS — R0782 Intercostal pain: Secondary | ICD-10-CM | POA: Diagnosis not present

## 2018-11-25 DIAGNOSIS — Z23 Encounter for immunization: Secondary | ICD-10-CM | POA: Diagnosis not present

## 2018-12-08 DIAGNOSIS — R222 Localized swelling, mass and lump, trunk: Secondary | ICD-10-CM | POA: Diagnosis not present

## 2022-02-15 DEATH — deceased
